# Patient Record
Sex: Male | Born: 2005 | Race: White | Hispanic: No | Marital: Single | Smoking: Never smoker
Health system: Southern US, Community
[De-identification: ages and names within clinical notes are randomized; demographics above are authoritative.]

---
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
## 2024-06-19 ENCOUNTER — Ambulatory Visit: Admitting: Family Medicine
